# Patient Record
Sex: Female | Born: 1970 | Race: White | Hispanic: No | Marital: Married | Smoking: Never smoker
Health system: Southern US, Community
[De-identification: ages and names within clinical notes are randomized; demographics above are authoritative.]

## PROBLEM LIST (undated history)

## (undated) DIAGNOSIS — T783XXA Angioneurotic edema, initial encounter: Secondary | ICD-10-CM

## (undated) DIAGNOSIS — G43909 Migraine, unspecified, not intractable, without status migrainosus: Secondary | ICD-10-CM

## (undated) HISTORY — DX: Angioneurotic edema, initial encounter: T78.3XXA

---

## 1997-05-26 ENCOUNTER — Other Ambulatory Visit: Admission: RE | Admit: 1997-05-26 | Discharge: 1997-05-26 | Payer: Self-pay | Admitting: Obstetrics and Gynecology

## 1997-12-22 ENCOUNTER — Other Ambulatory Visit: Admission: RE | Admit: 1997-12-22 | Discharge: 1997-12-22 | Payer: Self-pay | Admitting: Obstetrics and Gynecology

## 1997-12-23 ENCOUNTER — Ambulatory Visit (HOSPITAL_COMMUNITY): Admission: RE | Admit: 1997-12-23 | Discharge: 1997-12-23 | Payer: Self-pay | Admitting: Gynecology

## 1998-02-11 ENCOUNTER — Encounter: Admission: RE | Admit: 1998-02-11 | Discharge: 1998-05-12 | Payer: Self-pay | Admitting: Gynecology

## 1998-04-01 ENCOUNTER — Ambulatory Visit (HOSPITAL_COMMUNITY): Admission: RE | Admit: 1998-04-01 | Discharge: 1998-04-01 | Payer: Self-pay | Admitting: *Deleted

## 1999-01-04 ENCOUNTER — Other Ambulatory Visit: Admission: RE | Admit: 1999-01-04 | Discharge: 1999-01-04 | Payer: Self-pay | Admitting: Gynecology

## 1999-02-22 ENCOUNTER — Other Ambulatory Visit: Admission: RE | Admit: 1999-02-22 | Discharge: 1999-02-22 | Payer: Self-pay | Admitting: Gynecology

## 1999-02-22 ENCOUNTER — Encounter (INDEPENDENT_AMBULATORY_CARE_PROVIDER_SITE_OTHER): Payer: Self-pay | Admitting: Specialist

## 1999-03-16 ENCOUNTER — Emergency Department (HOSPITAL_COMMUNITY): Admission: EM | Admit: 1999-03-16 | Discharge: 1999-03-16 | Payer: Self-pay | Admitting: Emergency Medicine

## 1999-06-15 ENCOUNTER — Encounter: Admission: RE | Admit: 1999-06-15 | Discharge: 1999-09-13 | Payer: Self-pay | Admitting: Gynecology

## 1999-09-01 ENCOUNTER — Encounter: Payer: Self-pay | Admitting: Gynecology

## 1999-09-01 ENCOUNTER — Inpatient Hospital Stay (HOSPITAL_COMMUNITY): Admission: AD | Admit: 1999-09-01 | Discharge: 1999-09-01 | Payer: Self-pay | Admitting: Gynecology

## 1999-09-02 ENCOUNTER — Inpatient Hospital Stay (HOSPITAL_COMMUNITY): Admission: AD | Admit: 1999-09-02 | Discharge: 1999-09-04 | Payer: Self-pay | Admitting: Gynecology

## 1999-10-06 ENCOUNTER — Other Ambulatory Visit: Admission: RE | Admit: 1999-10-06 | Discharge: 1999-10-06 | Payer: Self-pay | Admitting: Gynecology

## 1999-11-22 ENCOUNTER — Encounter: Admission: RE | Admit: 1999-11-22 | Discharge: 1999-11-22 | Payer: Self-pay

## 2000-11-14 ENCOUNTER — Other Ambulatory Visit: Admission: RE | Admit: 2000-11-14 | Discharge: 2000-11-14 | Payer: Self-pay | Admitting: Gynecology

## 2001-11-20 ENCOUNTER — Other Ambulatory Visit: Admission: RE | Admit: 2001-11-20 | Discharge: 2001-11-20 | Payer: Self-pay | Admitting: Gynecology

## 2002-05-03 ENCOUNTER — Other Ambulatory Visit: Admission: RE | Admit: 2002-05-03 | Discharge: 2002-05-03 | Payer: Self-pay | Admitting: Gynecology

## 2002-11-22 ENCOUNTER — Other Ambulatory Visit: Admission: RE | Admit: 2002-11-22 | Discharge: 2002-11-22 | Payer: Self-pay | Admitting: Gynecology

## 2004-03-02 ENCOUNTER — Other Ambulatory Visit: Admission: RE | Admit: 2004-03-02 | Discharge: 2004-03-02 | Payer: Self-pay | Admitting: Gynecology

## 2005-06-01 ENCOUNTER — Other Ambulatory Visit: Admission: RE | Admit: 2005-06-01 | Discharge: 2005-06-01 | Payer: Self-pay | Admitting: Gynecology

## 2005-07-16 ENCOUNTER — Emergency Department (HOSPITAL_COMMUNITY): Admission: EM | Admit: 2005-07-16 | Discharge: 2005-07-17 | Payer: Self-pay | Admitting: Emergency Medicine

## 2006-09-29 ENCOUNTER — Other Ambulatory Visit: Admission: RE | Admit: 2006-09-29 | Discharge: 2006-09-29 | Payer: Self-pay | Admitting: Gynecology

## 2008-05-14 ENCOUNTER — Encounter: Admission: RE | Admit: 2008-05-14 | Discharge: 2008-05-14 | Payer: Self-pay | Admitting: Obstetrics and Gynecology

## 2008-07-15 ENCOUNTER — Inpatient Hospital Stay (HOSPITAL_COMMUNITY): Admission: RE | Admit: 2008-07-15 | Discharge: 2008-07-16 | Payer: Self-pay | Admitting: Obstetrics and Gynecology

## 2010-05-10 LAB — RPR: RPR Ser Ql: NONREACTIVE

## 2010-05-10 LAB — CBC
HCT: 32.3 % — ABNORMAL LOW (ref 36.0–46.0)
HCT: 34.2 % — ABNORMAL LOW (ref 36.0–46.0)
Hemoglobin: 11.6 g/dL — ABNORMAL LOW (ref 12.0–15.0)
Hemoglobin: 12.3 g/dL (ref 12.0–15.0)
MCHC: 36 g/dL (ref 30.0–36.0)
MCHC: 36 g/dL (ref 30.0–36.0)
MCV: 89.6 fL (ref 78.0–100.0)
MCV: 88.8 fL (ref 78.0–100.0)
Platelets: 159 10*3/uL (ref 150–400)
Platelets: 163 10*3/uL (ref 150–400)
RBC: 3.61 MIL/uL — ABNORMAL LOW (ref 3.87–5.11)
RBC: 3.85 MIL/uL — ABNORMAL LOW (ref 3.87–5.11)
RDW: 13.8 % (ref 11.5–15.5)
RDW: 14 % (ref 11.5–15.5)
WBC: 9.1 10*3/uL (ref 4.0–10.5)
WBC: 8 10*3/uL (ref 4.0–10.5)

## 2010-05-10 LAB — GLUCOSE, CAPILLARY
Glucose-Capillary: 101 mg/dL — ABNORMAL HIGH (ref 70–99)
Glucose-Capillary: 76 mg/dL (ref 70–99)
Glucose-Capillary: 80 mg/dL (ref 70–99)
Glucose-Capillary: 83 mg/dL (ref 70–99)
Glucose-Capillary: 88 mg/dL (ref 70–99)

## 2010-06-18 NOTE — Op Note (Signed)
[facility] of [facility]  Patient:    Megan Lawson, Megan Lawson                        MRN: [id number] Proc. Date: 09/01/99 Adm. Date:  [id number] Attending:  Tonye Royalty                           Operative Report  PREOPERATIVE DIAGNOSIS:  POSTOPERATIVE DIAGNOSIS:  OPERATION:  SURGEON:  Juan H. Lily Peer, M.D.  INDICATIONS:  The patient is a 40 year old gravida 3, para 2 with currently a 37-week gestation with suspected fetal macrosomia.  Ultrasound done in our office on July 31 demonstrated estimated fetal weight between 3830 gm and 4007 gm in the 97th percentile for 37 weeks.  The patient is gestational diabetic on insulin.  She presented to the radiology department to have amniocentesis for fetal lung maturity in preparation for an induction in effort to be able to deliver safely vaginally.  DESCRIPTION OF PROCEDURE:  The amniocentesis was carried out in sterile fashion and an amniotic fluid pocket was identified in the fundal region of the uterus and under sonographic guidance, a 23 gauge needle was introduced into the amniotic cavity after the abdomen was prepped and draped in the usual sterile fashion.  Approximately 15 cc of clear amniotic fluid was obtained and submitted to [facility] in effort to determine L.S. and P.G. for fetal lung maturity.  The pre- and postamniocentesis viability was noted and the patient was sent to the maternity admission whereby she had a nonstress test which was reactive and discharged home to await instructions depending on the results of the amniocentesis.  Patients blood type is A+. DD:  09/01/99 TD:  09/02/99 Job: [id number]

## 2010-06-18 NOTE — Discharge Summary (Signed)
[facility]  Patient:    Megan Lawson, Megan Lawson                        MRN: [id number] Adm. Date:  [id number] Disc. Date: [id number] Attending:  Tonye Royalty Dictator:   Antony Contras, [facility]                           Discharge Summary  DISCHARGE DIAGNOSES:          1. Intrauterine pregnancy at 37 weeks.                               2. History of gestational diabetes on insulin.                               3. History of rapid labors.                               4. Fetal macrosomia documented by ultrasound.  PROCEDURES:                   Normal spontaneous vaginal delivery over midline episiotomy with partial third-degree, delivery of viable infant.  HISTORY OF PRESENT ILLNESS:   The patient is a 40 year old gravida 3, para 2-0-0-0, with LMP of November 25, 1998, [facility] September 23, 1999.  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  PRENATAL LABS:                Blood type is A positive.  Antibody screen is negative, RPR, HBS, AGH, IV nonreactive, Rubella immune, MSAFT within normal limits, GBS was negative.  HOSPITAL COURSE AND TREATMENT:                The patient was admitted on September 02, 1999 for induction of labor.  She did have an amniocentesis on the morning of August 1 for fetal lung maturity, and induction was scheduled secondary to her history of fast labors, also suspected macrosomia per ultrasound in the office on August 31, 1999.  On admission, cervix was a centimeter, 6% effaced, -3 station.  The patient was placed on an insulin drip.  Artificial rupture of membranes revealed clear fluid.  She did progress to complete dilatation and delivered an Apgar 8 and 43 female infant weighing 8 pounds 2 ounces over midline episiotomy with a partial third-degree extension.   Postpartum course was uncomplicated.  She remained afebrile, no difficulty voiding, was able to be discharged on her second postpartum day.  LABORATORIES:                 CBC - hematocrit 35.9, hemoglobin 12.5, WBC 10, platelets 170.  DISPOSITION:                  Follow-up in six weeks.  Continue prenatal vitamins and iron, Motrin and Tylox for pain.  Continue to monitor blood sugars and keep record and present this at six weeks checkup. DD:  09/20/99 TD:  09/20/99 Job: [id number]

## 2010-06-18 NOTE — H&P (Signed)
[facility]  Patient:    Megan Lawson, Megan Lawson                          MRN: [id number] Adm. Date:  09/02/99 Attending:  Gaetano Hawthorne. Lily Peer, M.D.                         History and Physical  CHIEF COMPLAINT:              1. Fetal macrosomia.                               2. Gestational diabetic on insulin.                               3. History of rapid labors.                               4. Term pregnancy at [redacted] weeks gestation.  HISTORY OF PRESENT ILLNESS:   The patient is a 40 year old, gravida 3, para 2, with a corrected estimated date of confinement of September 23, 1999.  Patient is currently [redacted] weeks gestation, underwent amniocentesis on the morning of August 1 in an effort to determine fetal lung maturity for planned induction. Results pending at time of this dictation.  Patient has been followed with antepartum testing in the office.  Due to the fact that she had gestational diabetes requiring insulin for which her last insulin dose had been 14 units of NPH in the morning, 6 units of Regular in the evening, and 10 units of NPH before bedtime.  Patients ultrasound in the office on July 31 demonstrated fetal macrosomia that was suspected with an estimated fetal weight between 3830 g and 4007 g, approximately 97 percentile growth curve.  Patient has had a history in the past of rapid labors and birth weights have been 8 pounds, 6 ounces, and 9 pounds respectively.  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  Patients ultrasound of interest also has shown that  there was evidence of succenturiate placenta.  She also has positive group B strep culture which will be treated in labor.  She has history of panic attacks where she was taking Paxil 10 mg q. daily.  PAST MEDICAL HISTORY:         She had normal spontaneous vaginal deliveries in 1994 and 1997 of 8 pounds, 6 ounces, and 9 pounds respectively, and both with a history of rapid labor.  The first one lasted one hour and the second one lasted 30 minutes.  History of cervical dysplasia.  Patient denies any allergies.  REVIEW OF SYSTEMS:            ______  PHYSICAL EXAMINATION:  VITAL SIGNS:                  Blood pressure 120/50.  Urine was negative for protein or ketone.  Weight 186  pounds.  HEENT:                        Unremarkable.  NECK:                         Supple.  Trachea midline.  No carotid bruits, no thyromegaly.  LUNGS:                        Clear to auscultation without rhonchi or wheezes.  HEART:                        Regular rate and rhythm without any murmurs or gallops.  BREASTS:                      Examination was done during the first trimester and reportedly  normal.  ABDOMEN:                      Gravid uterus.  Fundal height 39 cm, vertex presentation by Thayer Ohm maneuver.  PELVIC:                       Cervix 1 cm, 60% effaced, -3 station.  DTR 1+, trace edema, negative clonus.  LABORATORY DATA:              Prenatal labs:  Blood type is A positive, negative antibody screen.  VDRL was nonreactive.  Hepatitis B surface antigen and HIV were negative.  Rubella titer with evidence of immunity.  Pap smear with CIN I.  Biopsy demonstrates CIN II.  Maternal serum alpha-fetoprotein within normal limits.  One-hour PC abnormal.  Three-hour GTP abnormal.  GBS culture positive.  ASSESSMENT:                   A 40 year old, gravida 3, para 2 at [redacted] weeks gestation with history of rapid labor.  Suspected fetal macrosomia on recent ultrasound July 31; estimated  fetal weight between 3830 g and 4007 g, in the 97th percentile for 37 weeks.  Patient underwent amniocentesis on the morning of August 1, for determination of fetal lung maturity, results pending at this dictation.  If the fetal lung maturity is evident, the patient is scheduled to come in on August 2 at 0600 hours for Pitocin induction so that we may be able to deliver her vaginally.  Will hold off her insulin in the morning and put her on the insulin drip protocol and monitor her sugars accordingly.  Due to the fact that she also has positive group B strep, will start her on penicillin for protocol, as well.  PLAN:                         As per assessment above. DD:  09/01/99 TD:  09/01/99 Job: [id number]

## 2010-08-26 ENCOUNTER — Other Ambulatory Visit: Payer: Self-pay | Admitting: Obstetrics and Gynecology

## 2010-08-26 DIAGNOSIS — N63 Unspecified lump in unspecified breast: Secondary | ICD-10-CM

## 2010-08-31 ENCOUNTER — Ambulatory Visit
Admission: RE | Admit: 2010-08-31 | Discharge: 2010-08-31 | Disposition: A | Discharge status: Home / Self Care | Payer: BC Managed Care – PPO | Source: Ambulatory Visit | Attending: Obstetrics and Gynecology | Admitting: Obstetrics and Gynecology

## 2010-08-31 DIAGNOSIS — N63 Unspecified lump in unspecified breast: Secondary | ICD-10-CM

## 2012-05-25 ENCOUNTER — Emergency Department (HOSPITAL_COMMUNITY)
Admission: EM | Admit: 2012-05-25 | Discharge: 2012-05-25 | Disposition: A | Discharge status: Home / Self Care | Payer: BC Managed Care – PPO | Attending: Emergency Medicine | Admitting: Emergency Medicine

## 2012-05-25 ENCOUNTER — Encounter (HOSPITAL_COMMUNITY): Payer: Self-pay | Admitting: *Deleted

## 2012-05-25 DIAGNOSIS — R112 Nausea with vomiting, unspecified: Secondary | ICD-10-CM | POA: Insufficient documentation

## 2012-05-25 DIAGNOSIS — H53149 Visual discomfort, unspecified: Secondary | ICD-10-CM | POA: Insufficient documentation

## 2012-05-25 DIAGNOSIS — G43909 Migraine, unspecified, not intractable, without status migrainosus: Secondary | ICD-10-CM | POA: Insufficient documentation

## 2012-05-25 HISTORY — DX: Migraine, unspecified, not intractable, without status migrainosus: G43.909

## 2012-05-25 MED ORDER — METOCLOPRAMIDE HCL 5 MG/ML IJ SOLN: 10.0000 mg | Freq: Once | INTRAMUSCULAR | Status: DC

## 2012-05-25 MED ORDER — HYDROMORPHONE HCL PF 1 MG/ML IJ SOLN
1.0000 mg | Freq: Once | INTRAMUSCULAR | Status: AC
  Administered 2012-05-25: 1 mg via INTRAVENOUS
  Filled 2012-05-25: qty 1

## 2012-05-25 MED ORDER — SUMATRIPTAN SUCCINATE 6 MG/0.5ML ~~LOC~~ SOLN
6.0000 mg | Freq: Once | SUBCUTANEOUS | Status: AC
  Administered 2012-05-25: 6 mg via SUBCUTANEOUS
  Filled 2012-05-25: qty 0.5

## 2012-05-25 MED ORDER — DIPHENHYDRAMINE HCL 50 MG/ML IJ SOLN
25.0000 mg | Freq: Once | INTRAMUSCULAR | Status: AC
  Administered 2012-05-25: 25 mg via INTRAVENOUS
  Filled 2012-05-25: qty 1

## 2012-05-25 MED ORDER — KETOROLAC TROMETHAMINE 30 MG/ML IJ SOLN: 30.0000 mg | Freq: Once | INTRAMUSCULAR | Status: DC

## 2012-05-25 MED ORDER — METOCLOPRAMIDE HCL 5 MG/ML IJ SOLN
10.0000 mg | INTRAMUSCULAR | Status: AC
  Administered 2012-05-25: 10 mg via INTRAVENOUS
  Filled 2012-05-25: qty 2

## 2012-05-25 MED ORDER — DIPHENHYDRAMINE HCL 50 MG/ML IJ SOLN: 25.0000 mg | Freq: Once | INTRAMUSCULAR | Status: DC

## 2012-05-25 MED ORDER — HYDROMORPHONE HCL PF 1 MG/ML IJ SOLN: 1.0000 mg | Freq: Once | INTRAMUSCULAR | Status: DC

## 2012-05-25 NOTE — ED Notes (Signed)
Pt with hx of migraines to ED c/o migraine and emesis x 4 days.  Was tx [facility] Wed with some relief.  Denies any relief with pain meds.  Called pcp and was told to come here.  Photophobic.

## 2012-05-25 NOTE — ED Provider Notes (Signed)
History     CSN: [id number]  Arrival date & time 05/25/12  1236   First MD Initiated Contact with Patient 05/25/12 1318      Chief Complaint  Patient presents with  . Headache  . Emesis    (Consider location/radiation/quality/duration/timing/severity/associated sxs/prior treatment) HPI Comments: Patient is a 42 year old female with a history of migraines who presents for a migraine headache x4 days. Patient states that migraine has been throbbing in nature, beginning in her forehead and radiating along her mid parietal region. Patient states the headache is constant and waxing and waning in severity without any aggravating or alleviating factors. Patient states that she took Maxalt 2 days ago without relief of symptoms. The patient is to associated photophobia, phonophobia, nausea, and nonbloody, nonbilious emesis. Patient was evaluated at [facility] 2 days ago for symptoms and was treated with Toradol and Dilaudid with temporary relief. Patient states that CT scan was done at this time without evidence of acute changes. Patient followed up with her primary care provider yesterday who performed basic lab work all of which was unremarkable. Patient was given Dilaudid tabs by her PCP which only mildly and very briefly provide her relief. Patient denies fevers, vision changes, neck pain or stiffness, chest pain, shortness of breath, abdominal pain, numbness or tingling in her extremities.  Patient is a 42 y.o. female presenting with headaches and vomiting. The history is provided by the patient. No language interpreter was used.  Headache Associated symptoms: nausea, photophobia and vomiting   Associated symptoms: no hearing loss, no neck pain, no neck stiffness and no numbness   Emesis Associated symptoms: headaches     Past Medical History  Diagnosis Date  . Migraines     History reviewed. No pertinent past surgical history.  No family history on file.  History  Substance Use  Topics  . Smoking status: Never Smoker   . Smokeless tobacco: Not on file  . Alcohol Use: No    OB History   Grav Para Term Preterm Abortions TAB SAB Ect Mult Living                  Review of Systems  HENT: Negative for hearing loss, neck pain, neck stiffness and tinnitus.   Eyes: Positive for photophobia.  Gastrointestinal: Positive for nausea and vomiting.  Neurological: Positive for headaches. Negative for numbness.  All other systems reviewed and are negative.    Allergies  Review of patient's allergies indicates no known allergies.  Home Medications   Current Outpatient Rx  Name  Route  Sig  Dispense  Refill  . HYDROmorphone (DILAUDID) 4 MG tablet   Oral   Take 4 mg by mouth every 6 (six) hours as needed for pain.         Marland Kitchen ibuprofen (ADVIL,MOTRIN) 200 MG tablet   Oral   Take 800 mg by mouth every 6 (six) hours as needed for pain.         . rizatriptan (MAXALT-MLT) 10 MG disintegrating tablet   Oral   Take 10 mg by mouth as needed for migraine. May repeat in 2 hours if needed           BP 154/93  Pulse 85  Temp(Src) 98.1 F (36.7 C) (Oral)  Resp 22  SpO2 98%  LMP 05/25/2012  Physical Exam  Nursing note and vitals reviewed. Constitutional: She is oriented to person, place, and time. She appears well-developed and well-nourished. No distress.  HENT:  Head: Normocephalic and  atraumatic.  Mouth/Throat: Oropharynx is clear and moist. No oropharyngeal exudate.  Eyes: Conjunctivae and EOM are normal. Pupils are equal, round, and reactive to light. No scleral icterus.  Neck: Normal range of motion. Neck supple.  Cardiovascular: Normal rate, regular rhythm, normal heart sounds and intact distal pulses.   Pulmonary/Chest: Effort normal and breath sounds normal. No respiratory distress. She has no wheezes. She has no rales.  Abdominal: Soft. She exhibits no distension and no mass. There is no tenderness. There is no rebound.  Musculoskeletal: Normal range  of motion. She exhibits no edema.  Lymphadenopathy:    She has no cervical adenopathy.  Neurological: She is alert and oriented to person, place, and time. She has normal reflexes. No cranial nerve deficit. GCS eye subscore is 4. GCS verbal subscore is 5. GCS motor subscore is 6.  Cranial nerves II through XII grossly intact. Patient has equal grip strength bilaterally with 5 out of 5 strength against resistance in her upper and lower extremities. DTRs normal and symmetric. No sensory or motor deficits appreciated.  Skin: Skin is warm and dry. No rash noted. She is not diaphoretic. No erythema.  Psychiatric: She has a normal mood and affect. Her behavior is normal.    ED Course  Procedures (including critical care time)  Labs Reviewed - No data to display No results found.   1. Migraine      MDM  Patient is a 42 year old female with a history of migraines who presents for migraine headache x4 days with associated photophobia, phonophobia, nausea, NB/NB emesis. Patient has been evaluated in [facility] ED with CT scan which was negative for hemorrhage, mass lesion, and hydrocephalus. Basic labs done in PCP office yesterday which were also negative. Patient has tried Maxalt and Dilaudid tabs without relief. On physical exam patient is neurovascularly intact without focal neurologic deficits. Will treat symptomatically with Imitrex, Reglan, and Benadryl.  Patient resting comfortably, sleeping with no complaints. Patient is well and nontoxic and hemodynamically stable. Will continue to monitor and reassess.  Patient endorses symptom improvement with migraine cocktail.  Patient hemodynamically stable and appropriate for discharge. Patient given 4 mg Dilaudid tablets by her PCP for migraine pain. Given duration of symptoms, will provide neurology follow up for further evaluation of symptoms. Indications for ED return discussed. Patient states comfort and understanding with this discharge plan with no  unaddressed concerns. Patient workup and management discussed with Dr. Oletta Lamas who was in agreement.      Antony Madura, PA-C 06/01/12 1911

## 2012-06-05 NOTE — ED Provider Notes (Signed)
Medical screening examination/treatment/procedure(s) were performed by non-physician practitioner and as supervising physician I was immediately available for consultation/collaboration.   Gavin Pound. Oletta Lamas, MD 06/05/12 2303

## 2014-03-03 ENCOUNTER — Other Ambulatory Visit: Payer: Self-pay | Admitting: Obstetrics and Gynecology

## 2014-03-03 DIAGNOSIS — N6321 Unspecified lump in the left breast, upper outer quadrant: Secondary | ICD-10-CM

## 2014-03-03 DIAGNOSIS — Z803 Family history of malignant neoplasm of breast: Secondary | ICD-10-CM

## 2014-03-05 ENCOUNTER — Other Ambulatory Visit: Payer: Self-pay | Admitting: Obstetrics and Gynecology

## 2014-03-05 ENCOUNTER — Ambulatory Visit
Admission: RE | Admit: 2014-03-05 | Discharge: 2014-03-05 | Disposition: A | Discharge status: Home / Self Care | Payer: BC Managed Care – PPO | Source: Ambulatory Visit | Attending: Obstetrics and Gynecology | Admitting: Obstetrics and Gynecology

## 2014-03-05 DIAGNOSIS — Z803 Family history of malignant neoplasm of breast: Secondary | ICD-10-CM

## 2014-03-05 DIAGNOSIS — N6321 Unspecified lump in the left breast, upper outer quadrant: Secondary | ICD-10-CM

## 2014-03-06 ENCOUNTER — Other Ambulatory Visit: Payer: Self-pay | Admitting: Obstetrics and Gynecology

## 2014-03-06 DIAGNOSIS — Z803 Family history of malignant neoplasm of breast: Secondary | ICD-10-CM

## 2014-03-06 DIAGNOSIS — R922 Inconclusive mammogram: Secondary | ICD-10-CM

## 2014-03-25 ENCOUNTER — Other Ambulatory Visit: Payer: Self-pay | Admitting: Obstetrics and Gynecology

## 2014-03-25 ENCOUNTER — Ambulatory Visit
Admission: RE | Admit: 2014-03-25 | Discharge: 2014-03-25 | Disposition: A | Discharge status: Home / Self Care | Payer: BC Managed Care – PPO | Source: Ambulatory Visit | Attending: Obstetrics and Gynecology | Admitting: Obstetrics and Gynecology

## 2014-03-25 DIAGNOSIS — R922 Inconclusive mammogram: Secondary | ICD-10-CM

## 2014-03-25 DIAGNOSIS — Z803 Family history of malignant neoplasm of breast: Secondary | ICD-10-CM

## 2014-03-25 MED ORDER — GADOBENATE DIMEGLUMINE 529 MG/ML IV SOLN
16.0000 mL | Freq: Once | INTRAVENOUS | Status: AC | PRN
  Administered 2014-03-25: 16 mL via INTRAVENOUS

## 2014-03-27 ENCOUNTER — Ambulatory Visit
Admission: RE | Admit: 2014-03-27 | Discharge: 2014-03-27 | Disposition: A | Discharge status: Home / Self Care | Payer: BC Managed Care – PPO | Source: Ambulatory Visit | Attending: Obstetrics and Gynecology | Admitting: Obstetrics and Gynecology

## 2014-03-27 DIAGNOSIS — R922 Inconclusive mammogram: Secondary | ICD-10-CM

## 2014-03-27 DIAGNOSIS — Z803 Family history of malignant neoplasm of breast: Secondary | ICD-10-CM

## 2014-03-31 ENCOUNTER — Other Ambulatory Visit: Payer: Self-pay | Admitting: Obstetrics and Gynecology

## 2014-03-31 DIAGNOSIS — R928 Other abnormal and inconclusive findings on diagnostic imaging of breast: Secondary | ICD-10-CM

## 2014-04-04 ENCOUNTER — Ambulatory Visit
Admission: RE | Admit: 2014-04-04 | Discharge: 2014-04-04 | Disposition: A | Discharge status: Home / Self Care | Payer: BC Managed Care – PPO | Source: Ambulatory Visit | Attending: Obstetrics and Gynecology | Admitting: Obstetrics and Gynecology

## 2014-04-04 ENCOUNTER — Other Ambulatory Visit: Payer: Self-pay | Admitting: Obstetrics and Gynecology

## 2014-04-04 ENCOUNTER — Ambulatory Visit: Payer: BC Managed Care – PPO

## 2014-04-04 DIAGNOSIS — R928 Other abnormal and inconclusive findings on diagnostic imaging of breast: Secondary | ICD-10-CM

## 2014-04-10 ENCOUNTER — Ambulatory Visit
Admission: RE | Admit: 2014-04-10 | Discharge: 2014-04-10 | Disposition: A | Discharge status: Home / Self Care | Payer: BC Managed Care – PPO | Source: Ambulatory Visit | Attending: Obstetrics and Gynecology | Admitting: Obstetrics and Gynecology

## 2014-04-10 ENCOUNTER — Other Ambulatory Visit: Payer: Self-pay | Admitting: Obstetrics and Gynecology

## 2014-04-10 DIAGNOSIS — R928 Other abnormal and inconclusive findings on diagnostic imaging of breast: Secondary | ICD-10-CM

## 2014-04-10 HISTORY — PX: BREAST BIOPSY: SHX20

## 2014-04-10 MED ORDER — GADOBENATE DIMEGLUMINE 529 MG/ML IV SOLN
18.0000 mL | Freq: Once | INTRAVENOUS | Status: AC | PRN
  Administered 2014-04-10: 18 mL via INTRAVENOUS

## 2014-07-29 ENCOUNTER — Other Ambulatory Visit: Payer: Self-pay

## 2014-07-31 LAB — CYTOLOGY - PAP

## 2015-09-08 ENCOUNTER — Other Ambulatory Visit: Payer: Self-pay | Admitting: Obstetrics and Gynecology

## 2015-09-08 DIAGNOSIS — N63 Unspecified lump in unspecified breast: Secondary | ICD-10-CM

## 2015-09-15 ENCOUNTER — Ambulatory Visit
Admission: RE | Admit: 2015-09-15 | Discharge: 2015-09-15 | Disposition: A | Discharge status: Home / Self Care | Payer: BC Managed Care – PPO | Source: Ambulatory Visit | Attending: Obstetrics and Gynecology | Admitting: Obstetrics and Gynecology

## 2015-09-15 ENCOUNTER — Other Ambulatory Visit: Payer: Self-pay | Admitting: Obstetrics and Gynecology

## 2015-09-15 DIAGNOSIS — N63 Unspecified lump in unspecified breast: Secondary | ICD-10-CM

## 2015-09-15 HISTORY — PX: BREAST BIOPSY: SHX20

## 2017-02-01 ENCOUNTER — Other Ambulatory Visit: Payer: Self-pay | Admitting: Obstetrics and Gynecology

## 2017-02-01 DIAGNOSIS — N63 Unspecified lump in unspecified breast: Secondary | ICD-10-CM

## 2017-02-01 DIAGNOSIS — N6489 Other specified disorders of breast: Secondary | ICD-10-CM

## 2017-02-13 ENCOUNTER — Other Ambulatory Visit: Payer: BC Managed Care – PPO

## 2017-02-15 ENCOUNTER — Ambulatory Visit
Admission: RE | Admit: 2017-02-15 | Discharge: 2017-02-15 | Disposition: A | Discharge status: Home / Self Care | Payer: BC Managed Care – PPO | Source: Ambulatory Visit | Attending: Obstetrics and Gynecology | Admitting: Obstetrics and Gynecology

## 2017-02-15 ENCOUNTER — Ambulatory Visit: Admission: RE | Admit: 2017-02-15 | Payer: BC Managed Care – PPO | Source: Ambulatory Visit

## 2017-02-15 DIAGNOSIS — N6489 Other specified disorders of breast: Secondary | ICD-10-CM

## 2018-02-09 ENCOUNTER — Other Ambulatory Visit: Payer: Self-pay | Admitting: Obstetrics and Gynecology

## 2018-02-09 DIAGNOSIS — Z1231 Encounter for screening mammogram for malignant neoplasm of breast: Secondary | ICD-10-CM

## 2018-03-19 ENCOUNTER — Ambulatory Visit
Admission: RE | Admit: 2018-03-19 | Discharge: 2018-03-19 | Disposition: A | Discharge status: Home / Self Care | Payer: BC Managed Care – PPO | Source: Ambulatory Visit | Attending: Obstetrics and Gynecology | Admitting: Obstetrics and Gynecology

## 2018-03-19 DIAGNOSIS — Z1231 Encounter for screening mammogram for malignant neoplasm of breast: Secondary | ICD-10-CM

## 2019-10-12 ENCOUNTER — Other Ambulatory Visit: Payer: Self-pay | Admitting: Oncology

## 2019-10-12 ENCOUNTER — Encounter: Payer: Self-pay | Admitting: Oncology

## 2019-10-12 ENCOUNTER — Telehealth: Payer: Self-pay | Admitting: Oncology

## 2019-10-12 DIAGNOSIS — U071 COVID-19: Secondary | ICD-10-CM

## 2019-10-12 NOTE — Telephone Encounter (Signed)
I connected by phone with  Mrs. Coover to discuss the potential use of an new treatment for mild to moderate COVID-19 viral infection in non-hospitalized patients.   This patient is a age/sex that meets the FDA criteria for Emergency Use Authorization of casirivimab\imdevimab.  Has a (+) direct SARS-CoV-2 viral test result 1. Has mild or moderate COVID-19  2. Is ? 48 years of age and weighs ? 40 kg 3. Is NOT hospitalized due to COVID-19 4. Is NOT requiring oxygen therapy or requiring an increase in baseline oxygen flow rate due to COVID-19 5. Is within 10 days of symptom onset 6. Has at least one of the high risk factor(s) for progression to severe COVID-19 and/or hospitalization as defined in EUA. ? Specific high risk criteria : Past Medical History:  Diagnosis Date  . Migraines   ?  ?    Symptom onset  10/08/19   I have spoken and communicated the following to the patient or parent/caregiver:   1. FDA has authorized the emergency use of casirivimab\imdevimab for the treatment of mild to moderate COVID-19 in adults and pediatric patients with positive results of direct SARS-CoV-2 viral testing who are 81 years of age and older weighing at least 40 kg, and who are at high risk for progressing to severe COVID-19 and/or hospitalization.   2. The significant known and potential risks and benefits of casirivimab\imdevimab, and the extent to which such potential risks and benefits are unknown.   3. Information on available alternative treatments and the risks and benefits of those alternatives, including clinical trials.   4. Patients treated with casirivimab\imdevimab should continue to self-isolate and use infection control measures (e.g., wear mask, isolate, social distance, avoid sharing personal items, clean and disinfect "high touch" surfaces, and frequent handwashing) according to CDC guidelines.    5. The patient or parent/caregiver has the option to accept or refuse  casirivimab\imdevimab .   After reviewing this information with the patient, The patient agreed to proceed with receiving casirivimab\imdevimab infusion and will be provided a copy of the Fact sheet prior to receiving the infusion.Mignon Pine, AGNP-C [phone] (Infusion Center Hotline)

## 2019-10-13 ENCOUNTER — Ambulatory Visit (HOSPITAL_COMMUNITY)
Admission: RE | Admit: 2019-10-13 | Discharge: 2019-10-13 | Disposition: A | Discharge status: Home / Self Care | Payer: BC Managed Care – PPO | Source: Ambulatory Visit | Attending: Pulmonary Disease | Admitting: Pulmonary Disease

## 2019-10-13 DIAGNOSIS — U071 COVID-19: Secondary | ICD-10-CM | POA: Insufficient documentation

## 2019-10-13 MED ORDER — SODIUM CHLORIDE 0.9 % IV SOLN
1200.0000 mg | Freq: Once | INTRAVENOUS | Status: AC
  Administered 2019-10-13: 1200 mg via INTRAVENOUS
  Filled 2019-10-13: qty 10

## 2019-10-13 MED ORDER — EPINEPHRINE 0.3 MG/0.3ML IJ SOAJ: 0.3000 mg | Freq: Once | INTRAMUSCULAR | Status: DC | PRN

## 2019-10-13 MED ORDER — METHYLPREDNISOLONE SODIUM SUCC 125 MG IJ SOLR: 125.0000 mg | Freq: Once | INTRAMUSCULAR | Status: DC | PRN

## 2019-10-13 MED ORDER — ALBUTEROL SULFATE HFA 108 (90 BASE) MCG/ACT IN AERS: 2.0000 | INHALATION_SPRAY | Freq: Once | RESPIRATORY_TRACT | Status: DC | PRN

## 2019-10-13 MED ORDER — DIPHENHYDRAMINE HCL 50 MG/ML IJ SOLN: 50.0000 mg | Freq: Once | INTRAMUSCULAR | Status: DC | PRN

## 2019-10-13 MED ORDER — FAMOTIDINE IN NACL 20-0.9 MG/50ML-% IV SOLN: 20.0000 mg | Freq: Once | INTRAVENOUS | Status: DC | PRN

## 2019-10-13 MED ORDER — SODIUM CHLORIDE 0.9 % IV SOLN: INTRAVENOUS | Status: DC | PRN

## 2019-10-13 NOTE — Discharge Instructions (Signed)

## 2019-10-13 NOTE — Progress Notes (Signed)
  Diagnosis: COVID-19  Physician: Dr. Wright  Procedure: [facility] Med: casirivimab\imdevimab infusion - Provided patient with casirivimab\imdevimab fact sheet for patients, parents and caregivers prior to infusion.  Complications: No immediate complications noted.  Discharge: Discharged home   Judianne Seiple M Jenine Krisher 10/13/2019  

## 2020-12-20 IMAGING — MG DIGITAL SCREENING BILATERAL MAMMOGRAM WITH TOMO AND CAD
8 series · 8 of 24 positions shown · non-contrast
Comparison: Previous exam(s).

CLINICAL DATA: Screening.

EXAM:
DIGITAL SCREENING BILATERAL MAMMOGRAM WITH TOMO AND CAD

[R CC synth-2D]
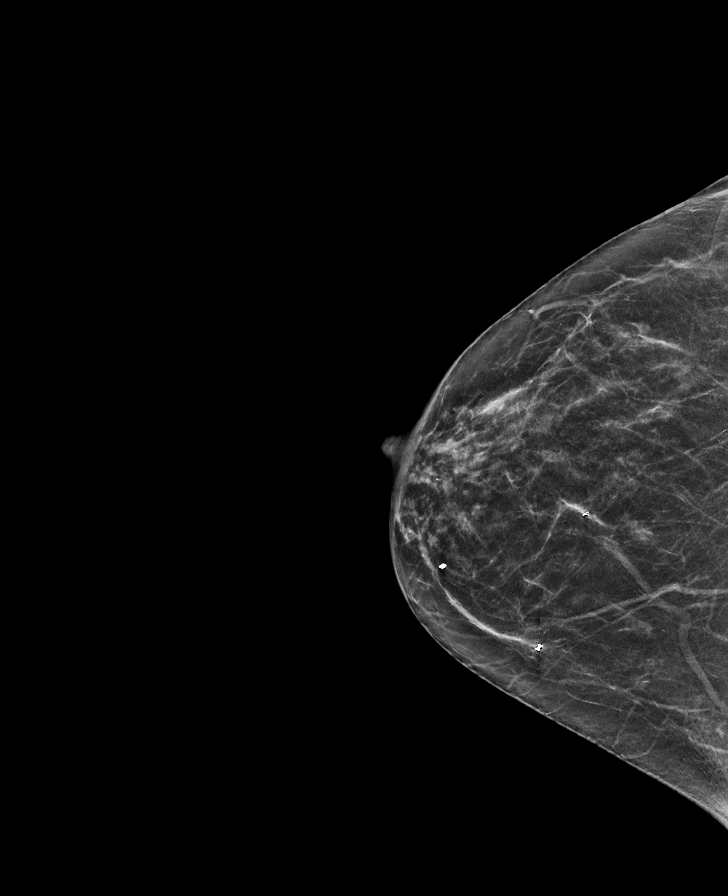

[R MLO synth-2D]
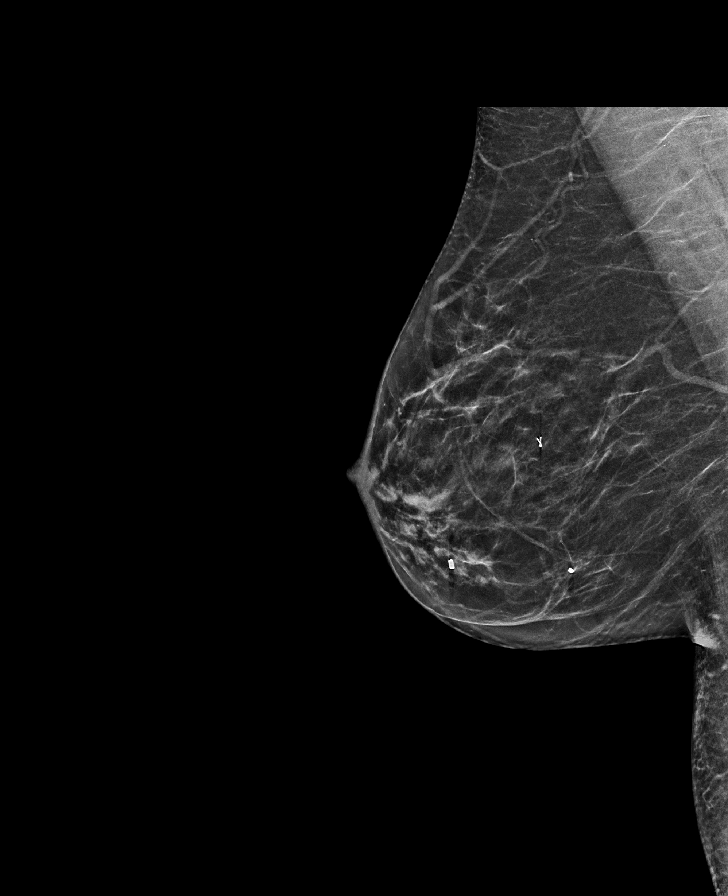

[L CC synth-2D]
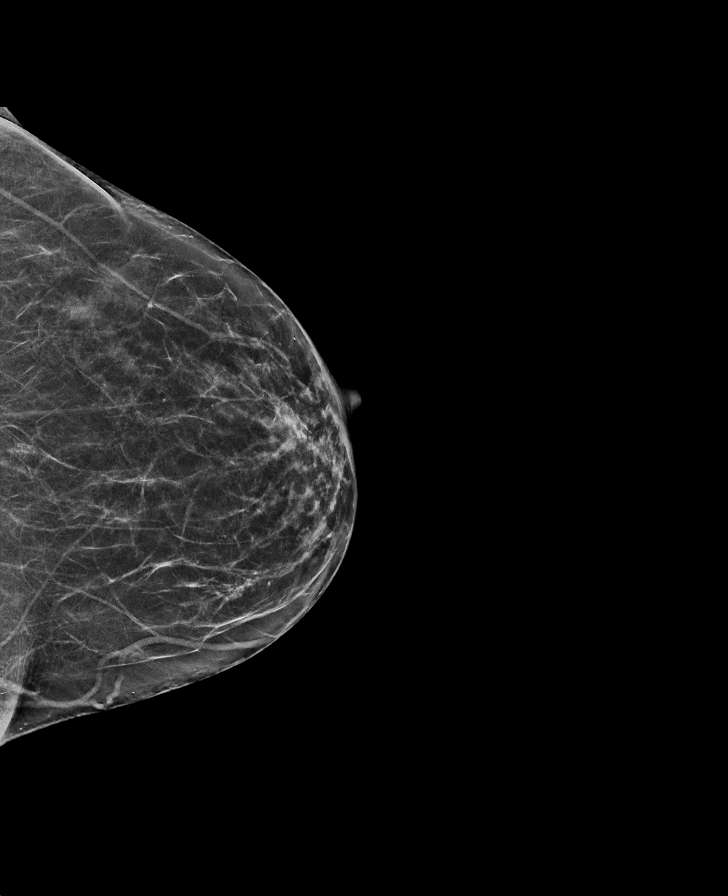

[L MLO synth-2D]
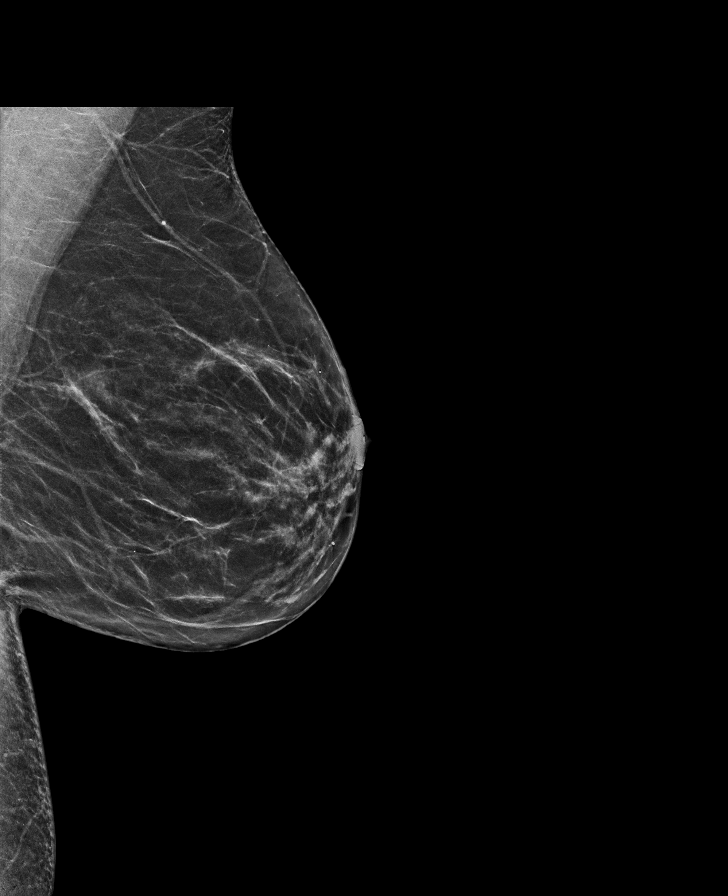

[L CC tomo · tomo slice 31/60.0]
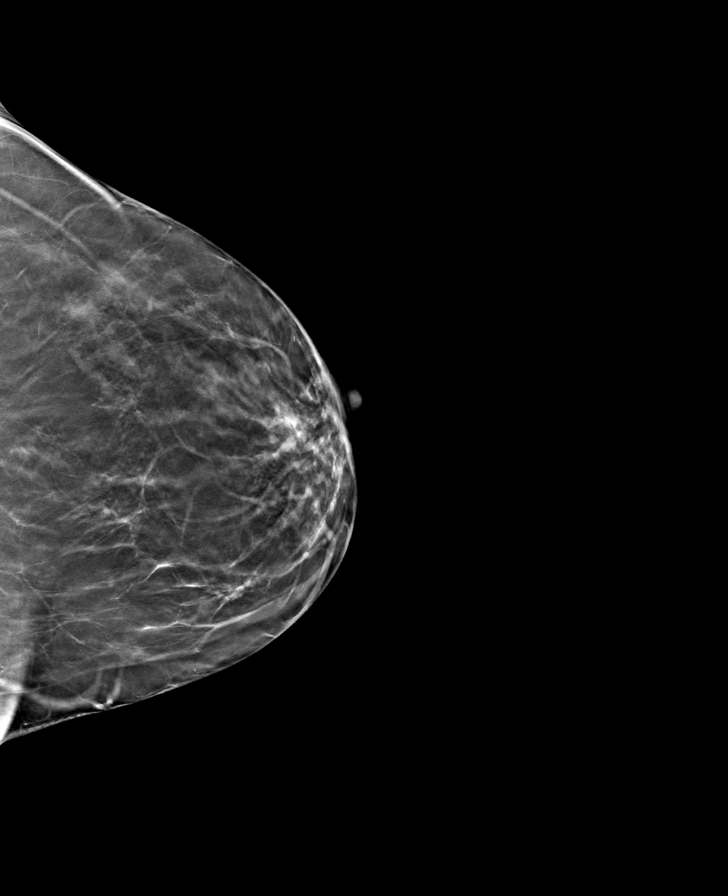

[L MLO tomo · tomo slice 32/63.0]
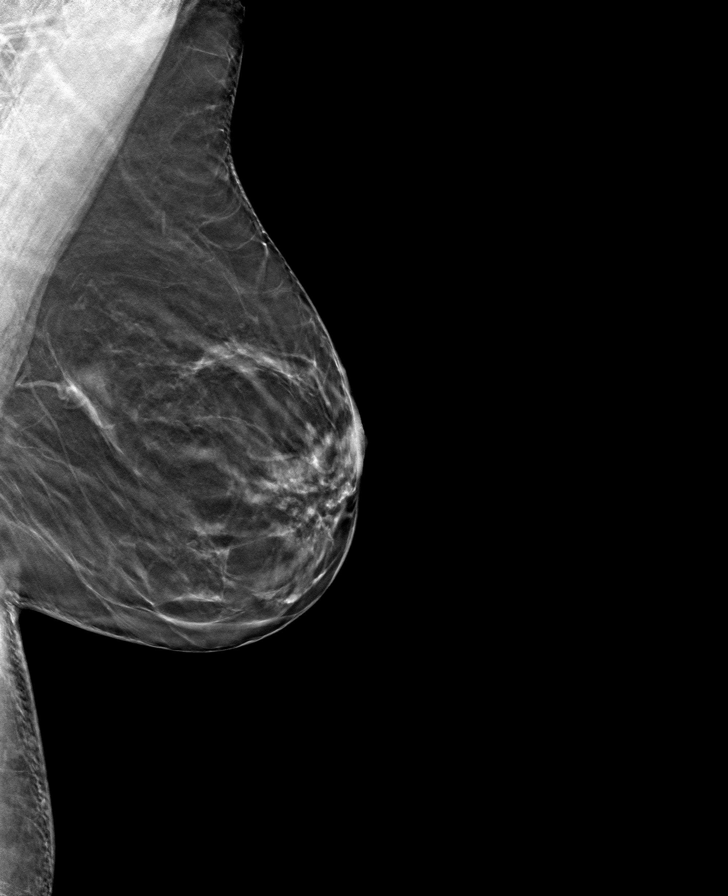

[R MLO tomo · tomo slice 31/61.0]
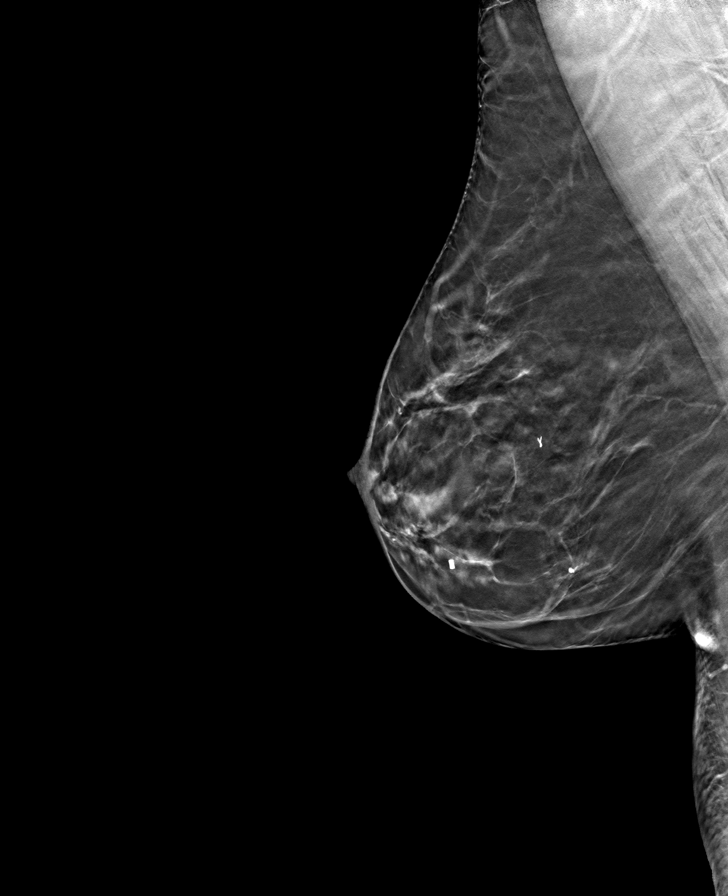

[R CC tomo · tomo slice 29/56.0]
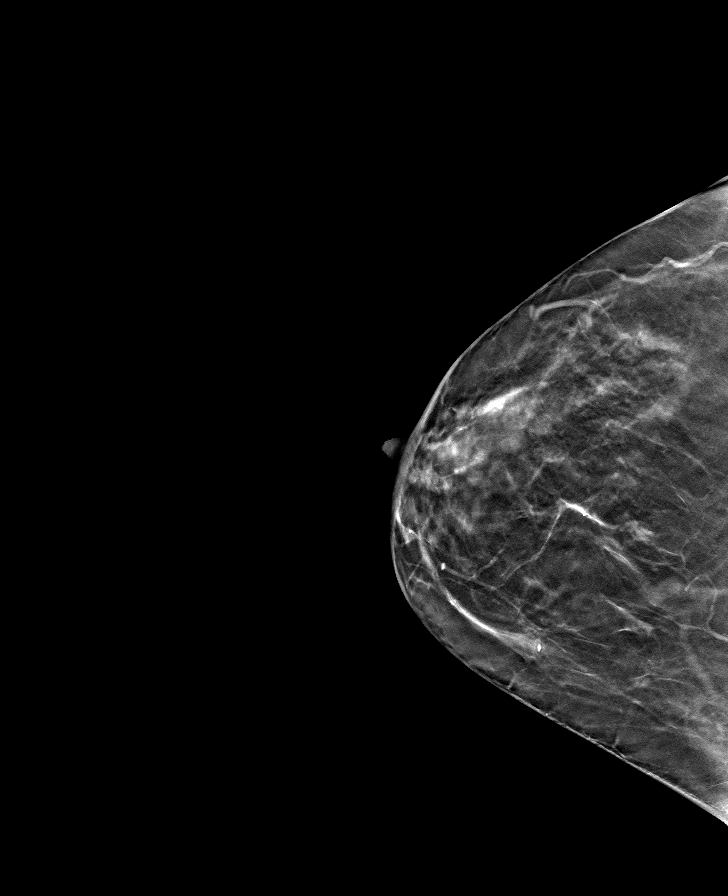

[8 of 24 positions shown; findings below may reference images not displayed]

ACR Breast Density Category b: There are scattered areas of
fibroglandular density.
FINDINGS: There are no findings suspicious for malignancy. Images were
processed with CAD.
IMPRESSION: No mammographic evidence of malignancy. A result letter of this
screening mammogram will be mailed directly to the patient.

RECOMMENDATION:
Screening mammogram in one year. (Code:CN-U-775)

BI-RADS CATEGORY  1: Negative.

## 2023-01-24 ENCOUNTER — Other Ambulatory Visit: Payer: Self-pay

## 2023-01-24 ENCOUNTER — Ambulatory Visit: Payer: BC Managed Care – PPO | Admitting: Internal Medicine

## 2023-01-24 ENCOUNTER — Encounter: Payer: Self-pay | Admitting: Internal Medicine

## 2023-01-24 VITALS — BP 110/78 | HR 85 | Temp 97.9°F | Resp 16 | Ht 66.5 in | Wt 139.5 lb

## 2023-01-24 DIAGNOSIS — T783XXA Angioneurotic edema, initial encounter: Secondary | ICD-10-CM

## 2023-01-24 DIAGNOSIS — J3089 Other allergic rhinitis: Secondary | ICD-10-CM

## 2023-01-24 DIAGNOSIS — I10 Essential (primary) hypertension: Secondary | ICD-10-CM

## 2023-01-24 DIAGNOSIS — T783XXD Angioneurotic edema, subsequent encounter: Secondary | ICD-10-CM

## 2023-01-24 MED ORDER — LOSARTAN POTASSIUM-HCTZ 50-12.5 MG PO TABS: 1.0000 | ORAL_TABLET | Freq: Every day | ORAL | 0 refills | Status: AC

## 2023-01-24 MED ORDER — CETIRIZINE HCL 10 MG PO TABS: 10.0000 mg | ORAL_TABLET | Freq: Two times a day (BID) | ORAL | 5 refills | Status: AC | PRN

## 2023-01-24 MED ORDER — FAMOTIDINE 20 MG PO TABS: 20.0000 mg | ORAL_TABLET | Freq: Two times a day (BID) | ORAL | 5 refills | Status: DC | PRN

## 2023-01-24 NOTE — Patient Instructions (Addendum)
Angioedema (Swelling)  - Stop lisinopril-hydrochlorothiazide due to angioedema.  Please let Dr. Egbert Garibaldi that we are switching to losartan 50mg  -hydrochlorothiazide 12.5mg . Follow up with him for further BP management; please check BP at home in the morning and keep a log.   - If swelling recurs, take Zyrtec 10mg  twice daily and Pepcid 20mg  twice daily.  - Will do labwork also today.   Other Allergic Rhinitis: - Use nasal saline rinses before nose sprays such as with Neilmed Sinus Rinse to clean out the nose as needed.  Use distilled water.   - Hold all anti-histamines (Xyzal, Allegra, Zyrtec, Claritin, Benadryl) 3 days prior to next visit.    Follow up: 12/31 at 1:30 PM 1-68

## 2023-01-24 NOTE — Progress Notes (Signed)
NEW PATIENT  Date of Service/Encounter:  01/24/23  Consult requested by: No primary care provider on file.   Subjective:   Megan Lawson (DOB: 08-29-1970) is a 52 y.o. female who presents to the clinic on 01/24/2023 with a chief complaint of Angioedema (Only on the face x3 since march) .    History obtained from: chart review and patient.  Angioedema: Has had 3 episodes. Initially March 26th 2024, after colonoscopy was on the way home and noted some tingling to face.  And then noted top lip and unilateral cheek swelling.  No latex exposure intra-op.  Did eat chili's soup but the symptoms started prior to eating. Then next episode September/October after eating deer meat for dinner and sxs of lip swelling occurred in early morning.  No hx of tick bites. Has eaten deer meat since then without any issues. Next episode in November, ate spaghetti with hamburger and next AM had lip/cheek swelling. Episodes last 2-4 days. Denies any pruritus/hives.  Does note some tingling/numbness in area.  No stressors No illness Wegovy started 2 years ago Has been on 20+ years lisinopril-hydrochlorothiazide  Estrace started about 1 year prior  Follows with Dr Egbert Garibaldi PCP and planning to see him next month due to worsening anxiety despite Wellbutrin.  No family hx of swelling.   Rhinitis:  Started since younger. Symptoms include: nasal congestion, rhinorrhea, and watery eyes  Occurs seasonally-Fall/Winter Potential triggers: not sure  Treatments tried:  PRN OTC anti histamines, nothing recently   Previous allergy testing: no History of sinus surgery: no Nonallergic triggers: none    Reviewed:  09/30/2022: seen in urgent care for lip swelling, localized.  Patent airway.  Given solumedrol IM and informed to use benadryl PRN.    04/25/2022: seen at [facility] for colitis, infectious.  CT AP with large bowel colitis.  Given entiemetics, IV fluids, pain control.  Also with acute cystitis,  given Rocephin   04/29/2021: seen in urgent care for LRTI and subacute cough. Given azithromycin, codeine cough syrup, Albuterol inhaler, tessalon perles, decadron.  No hx of asthma.   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 If you have any questions, ask your nurse or doctor.          STOP taking these medications    lisinopril-hydrochlorothiazide 20-12.5 MG tablet Commonly known as: ZESTORETIC Stopped by: Birder Robson       TAKE these medications    buPROPion 300 MG 24 hr tablet Commonly known as: WELLBUTRIN XL Take 1 tablet by mouth daily.   estradiol 2 MG tablet Commonly known as: ESTRACE Take 1 tablet by mouth daily.   HYDROmorphone 4 MG tablet Commonly known as: DILAUDID Take 4 mg by mouth every 6 (six) hours as needed for pain.   ibuprofen 200 MG tablet Commonly known as: ADVIL Take 800  mg by mouth every 6 (six) hours as needed for pain.   losartan-hydrochlorothiazide 50-12.5 MG tablet Commonly known as: HYZAAR Take 1 tablet by mouth daily. Started by: Birder Robson   omeprazole 40 MG capsule Commonly known as: PRILOSEC TAKE 1 CAPSULE BY MOUTH DAILY 30 MINUTES BEFORE BREAKFAST   progesterone 100 MG capsule Commonly known as: PROMETRIUM TAKE 1 CAPSULE BY MOUTH EVERY DAY AT BEDTIME   rizatriptan 10 MG disintegrating tablet Commonly known as: MAXALT-MLT Take 10 mg by mouth as needed  for migraine. May repeat in 2 hours if needed   Wegovy 0.5 MG/0.5ML Soaj Generic drug: Semaglutide-Weight Management Inject by subcutaneous route.         REVIEW OF SYSTEMS: Pertinent positives and negatives discussed in HPI.   Objective:   Physical Exam: BP 110/78 (BP Location: Right Arm, Patient Position: Sitting, Cuff Size: Normal)   Pulse 85   Temp 97.9 F (36.6 C) (Temporal)   Resp 16   Ht 5' 6.5" (1.689 m)   Wt 139 lb 8 oz (63.3 kg)   SpO2 97%   BMI 22.18 kg/m  Body mass index is 22.18 kg/m. GEN: alert, well developed HEENT: clear conjunctiva,  nose with + mild inferior turbinate hypertrophy, pink nasal mucosa, no rhinorrhea, no cobblestoning HEART: regular rate and rhythm, no murmur LUNGS: clear to auscultation bilaterally, no coughing, unlabored respiration ABDOMEN: soft, non distended  SKIN: no rashes or lesions  Assessment:   1. Other allergic rhinitis   2. Angioedema, subsequent encounter   3. Benign essential HTN     Plan/Recommendations:   Angioedema (Swelling)  HTN - Unilateral swelling located to lips/cheeks lasting 2-4 days, no hives/pruritus.  On ACE-I, discussed stopping.  Will also do trial of anti histamines.  Will do labwork to identify any genetic causes, mast cell dx, alpha gal, autoimmune dx, thyroid/renal/liver dysfunction. 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  - Stop lisinopril-hydrochlorothiazide due to angioedema.  Please let Dr. Egbert Garibaldi that we are switching to losartan 50mg  -hydrochlorothiazide 12.5mg . Follow up with him for further BP management; please check BP at home in the morning and keep a log.   - If swelling recurs, take Zyrtec 10mg  twice daily and Pepcid 20mg  twice daily.   Other Allergic Rhinitis: - Due to turbinate hypertrophy, seasonal symptoms and unresponsive to over the counter meds, will perform skin testing to identify aeroallergen triggers.   - Use nasal  saline rinses before nose sprays such as with Neilmed Sinus Rinse to clean out the nose as needed.  Use distilled water.   - Hold all anti-histamines (Xyzal, Allegra, Zyrtec, Claritin, Benadryl) 3 days prior to next visit.    Follow up: 12/31 at 1:30 PM 1-68  Alesia Morin, MD [facility]

## 2023-01-31 ENCOUNTER — Ambulatory Visit: Payer: BC Managed Care – PPO | Admitting: Internal Medicine

## 2023-01-31 DIAGNOSIS — J310 Chronic rhinitis: Secondary | ICD-10-CM

## 2023-01-31 DIAGNOSIS — T783XXD Angioneurotic edema, subsequent encounter: Secondary | ICD-10-CM

## 2023-01-31 NOTE — Progress Notes (Signed)
 FOLLOW UP Date of Service/Encounter:  01/31/23   Subjective:  Megan Lawson (DOB: Mar 23, 1970) is a 52 y.o. female who [facility] for follow up for skin testing.   History obtained from: chart review and patient.  Anti histamines held.   Past Medical History: Past Medical History:  Diagnosis Date   Angio-edema    Migraines     Objective:  There were no vitals taken for this visit. There is no height or weight on file to calculate BMI. Physical Exam: GEN: alert, well developed HEENT: clear conjunctiva, MMM LUNGS: unlabored respiration   Skin Testing:  Skin prick testing was placed, which includes aeroallergens/foods, histamine control, and saline control.  Verbal consent was obtained prior to placing test.  Patient tolerated procedure well.  Allergy  testing results were read and interpreted by myself, documented by clinical staff. Adequate positive and negative control.  Positive results to:  Results discussed with patient/family.  Airborne Adult Perc - 01/31/23 1334     Time Antigen Placed 1334    Allergen Manufacturer Jestine    Location Back    Number of Test 55    Panel 1 Select    1. Control-Buffer 50% Glycerol Negative    2. Control-Histamine 3+    3. Bahia Negative    4. Bermuda Negative    5. Johnson Negative    6. [place]  Blue Negative    7. Meadow Fescue Negative    8. Perennial Rye Negative    9. Timothy Negative    10. Ragweed Mix Negative    11. Cocklebur Negative    12. Plantain,  English Negative    13. Baccharis Negative    14. Dog Fennel Negative    15. Russian Thistle Negative    16. Lamb's Quarters Negative    17. Sheep Sorrell Negative    18. Rough Pigweed Negative    19. Marsh Elder, Rough Negative    20. Mugwort, Common Negative    21. Box, Elder Negative    22. Cedar, red Negative    23. Sweet Gum Negative    24. Pecan Pollen Negative    25. Pine Mix Negative    26. Walnut, Black  Pollen Negative    27. Red Mulberry Negative    28. Ash Mix Negative    29. Birch Mix Negative    30. Beech American Negative    31. Cottonwood, Eastern Negative    32. Hickory, White Negative    33. Maple Mix Negative    34. Oak, Eastern Mix Negative    35. Sycamore Eastern Negative    36. Alternaria Alternata Negative    37. Cladosporium Herbarum Negative    38. Aspergillus Mix Negative    39. Penicillium Mix Negative    40. Bipolaris Sorokiniana (Helminthosporium) Negative    41. Drechslera Spicifera (Curvularia) Negative    42. Mucor Plumbeus Negative    43. Fusarium Moniliforme Negative    44. Aureobasidium Pullulans (pullulara) Negative    45. Rhizopus Oryzae Negative    46. Botrytis Cinera Negative    47. Epicoccum Nigrum Negative    48. Phoma Betae Negative    49. Dust Mite Mix Negative    50. Cat Hair 10,000 BAU/ml Negative    51.  Dog Epithelia Negative    52. Mixed Feathers Negative    53. Horse Epithelia Negative    54. Cockroach, German Negative    55. Tobacco Leaf Negative  13 Food Perc - 01/31/23 1334       Test Information   Time Antigen Placed 1334    Allergen Manufacturer Jestine    Location Back    Number of allergen test 13    Food Select      Food   1. Peanut Negative    2. Soybean Negative    3. Wheat Negative    4. Sesame Negative    5. Milk, Cow Negative    6. Casein Negative    7. Egg White, Chicken Negative    8. Shellfish Mix Negative    9. Fish Mix Negative    10. Cashew Negative    11. Walnut Food Negative    12. Almond Negative    13. Hazelnut Negative              Assessment:   1. Angioedema, subsequent encounter   2. Chronic rhinitis     Plan/Recommendations:  Angioedema (Swelling)  - Unilateral swelling located to lips/cheeks lasting 2-4 days, no hives/pruritus. On ACE-I, discussed stopping. Will also do trial of anti histamines.  - Stop lisinopril-hydrochlorothiazide due to angioedema.  Please let  Dr. Sabas that we are switching to losartan  50mg  -hydrochlorothiazide 12.5mg . Follow up with him for further BP management; please check BP at home in the morning and keep a log.   - 01/2023: normal CBC diff, CMP, TSH, Tryptase; negative ANA/Alpha Gal; pending C1q and C1 esterase function  - If swelling recurs, take Zyrtec  10mg  twice daily and Pepcid  20mg  twice daily.   Other Allergic Rhinitis: - Due to turbinate hypertrophy, seasonal symptoms and unresponsive to over the counter meds, will perform skin testing to identify aeroallergen triggers.   - Positive skin test 01/2023: none  - Use Zyrtec  10 mg daily as needed for runny nose, sneezing, itchy watery eyes.   Return in about 3 months (around 05/01/2023).  Arleta Blanch, MD [facility]  

## 2023-01-31 NOTE — Patient Instructions (Addendum)
 Angioedema (Swelling)  - Stop lisinopril-hydrochlorothiazide due to angioedema.  Please let Dr. Sabas that we are switching to losartan  50mg  -hydrochlorothiazide 12.5mg . Follow up with him for further BP management; please check BP at home in the morning and keep a log.   - 01/2023: normal CBC diff, CMP, TSH, Tryptase; negative ANA/Alpha Gal; pending C1q and C1 esterase function  - SPT 01/2023: negative for commonly allergenic foods and aeroallergens  - If swelling recurs, take Zyrtec  10mg  twice daily and Pepcid  20mg  twice daily.   Chronic Rhinitis: - Positive skin test 01/2023: none  - Use Zyrtec  10 mg daily as needed for runny nose, sneezing, itchy watery eyes.

## 2023-02-03 ENCOUNTER — Encounter: Payer: Self-pay | Admitting: Internal Medicine

## 2023-02-03 LAB — CBC WITH DIFFERENTIAL/PLATELET
Basophils Absolute: 0 10*3/uL (ref 0.0–0.2)
Basos: 1 %
EOS (ABSOLUTE): 0.1 10*3/uL (ref 0.0–0.4)
Eos: 4 %
Hematocrit: 39.8 % (ref 34.0–46.6)
Hemoglobin: 13.2 g/dL (ref 11.1–15.9)
Immature Grans (Abs): 0 10*3/uL (ref 0.0–0.1)
Immature Granulocytes: 0 %
Lymphocytes Absolute: 1.1 10*3/uL (ref 0.7–3.1)
Lymphs: 31 %
MCH: 31.9 pg (ref 26.6–33.0)
MCHC: 33.2 g/dL (ref 31.5–35.7)
MCV: 96 fL (ref 79–97)
Monocytes Absolute: 0.3 10*3/uL (ref 0.1–0.9)
Monocytes: 9 %
Neutrophils Absolute: 1.9 10*3/uL (ref 1.4–7.0)
Neutrophils: 55 %
Platelets: 250 10*3/uL (ref 150–450)
RBC: 4.14 x10E6/uL (ref 3.77–5.28)
RDW: 11.4 % — ABNORMAL LOW (ref 11.7–15.4)
WBC: 3.5 10*3/uL (ref 3.4–10.8)

## 2023-02-03 LAB — CMP14+EGFR
ALT: 7 [IU]/L (ref 0–32)
AST: 14 [IU]/L (ref 0–40)
Albumin: 4.2 g/dL (ref 3.8–4.9)
Alkaline Phosphatase: 58 [IU]/L (ref 44–121)
BUN/Creatinine Ratio: 20 (ref 9–23)
BUN: 18 mg/dL (ref 6–24)
Bilirubin Total: 0.3 mg/dL (ref 0.0–1.2)
CO2: 21 mmol/L (ref 20–29)
Calcium: 9 mg/dL (ref 8.7–10.2)
Chloride: 101 mmol/L (ref 96–106)
Creatinine, Ser: 0.89 mg/dL (ref 0.57–1.00)
Globulin, Total: 2.3 g/dL (ref 1.5–4.5)
Glucose: 79 mg/dL (ref 70–99)
Potassium: 4.5 mmol/L (ref 3.5–5.2)
Sodium: 139 mmol/L (ref 134–144)
Total Protein: 6.5 g/dL (ref 6.0–8.5)
eGFR: 78 mL/min/{1.73_m2} (ref >59)

## 2023-02-03 LAB — C1 ESTERASE INHIBITOR, FUNCTIONAL: C1INH Functional/C1INH Total MFr SerPl: >110 %{normal}

## 2023-02-03 LAB — TSH+FREE T4
Free T4: 1.16 ng/dL (ref 0.82–1.77)
TSH: 1.67 u[IU]/mL (ref 0.450–4.500)

## 2023-02-03 LAB — ALPHA-GAL PANEL
Allergen Lamb IgE: <0.1 kU/L
Beef IgE: <0.1 kU/L
IgE (Immunoglobulin E), Serum: 10 [IU]/mL (ref 6–495)
O215-IgE Alpha-Gal: <0.1 kU/L
Pork IgE: <0.1 kU/L

## 2023-02-03 LAB — TRYPTASE: Tryptase: 2.8 ug/L (ref 2.2–13.2)

## 2023-02-03 LAB — COMPLEMENT COMPONENT C1Q: Complement C1Q: 11.5 mg/dL (ref 10.3–20.5)

## 2023-02-03 LAB — ANA W/REFLEX: Anti Nuclear Antibody (ANA): NEGATIVE

## 2023-08-11 ENCOUNTER — Other Ambulatory Visit: Payer: Self-pay | Admitting: *Deleted

## 2023-08-11 MED ORDER — FAMOTIDINE 20 MG PO TABS: 20.0000 mg | ORAL_TABLET | Freq: Two times a day (BID) | ORAL | 0 refills | Status: AC | PRN

## 2023-09-12 ENCOUNTER — Other Ambulatory Visit: Payer: Self-pay | Admitting: Internal Medicine
